# Patient Record
Sex: Male | Born: 1950 | Race: White | Hispanic: No
Health system: Midwestern US, Academic
[De-identification: ages and names within clinical notes are randomized; demographics above are authoritative.]

---

## 2016-12-27 ENCOUNTER — Encounter: Admit: 2016-12-27 | Discharge: 2016-12-27 | Payer: MEDICARE

## 2016-12-27 DIAGNOSIS — M545 Low back pain: Principal | ICD-10-CM

## 2017-01-11 ENCOUNTER — Ambulatory Visit: Admit: 2017-01-11 | Discharge: 2017-01-11 | Payer: MEDICARE

## 2017-01-11 ENCOUNTER — Encounter: Admit: 2017-01-11 | Discharge: 2017-01-11 | Payer: MEDICARE

## 2017-01-11 DIAGNOSIS — M48062 Spinal stenosis, lumbar region with neurogenic claudication: ICD-10-CM

## 2017-01-11 DIAGNOSIS — F99 Mental disorder, not otherwise specified: ICD-10-CM

## 2017-01-11 DIAGNOSIS — I10 Essential (primary) hypertension: Principal | ICD-10-CM

## 2017-01-11 DIAGNOSIS — M4802 Spinal stenosis, cervical region: ICD-10-CM

## 2017-01-11 DIAGNOSIS — M545 Low back pain: Principal | ICD-10-CM

## 2017-01-11 DIAGNOSIS — M48061 Spinal stenosis, lumbar region without neurogenic claudication: ICD-10-CM

## 2017-01-11 DIAGNOSIS — J302 Other seasonal allergic rhinitis: ICD-10-CM

## 2017-01-11 DIAGNOSIS — K5792 Diverticulitis of intestine, part unspecified, without perforation or abscess without bleeding: ICD-10-CM

## 2017-01-11 DIAGNOSIS — L0390 Cellulitis, unspecified: ICD-10-CM

## 2017-01-11 DIAGNOSIS — R200 Anesthesia of skin: Secondary | ICD-10-CM

## 2017-01-11 DIAGNOSIS — M549 Dorsalgia, unspecified: ICD-10-CM

## 2017-01-11 DIAGNOSIS — M1990 Unspecified osteoarthritis, unspecified site: ICD-10-CM

## 2017-01-11 DIAGNOSIS — E785 Hyperlipidemia, unspecified: ICD-10-CM

## 2017-02-08 ENCOUNTER — Ambulatory Visit: Admit: 2017-02-08 | Discharge: 2017-02-09 | Payer: MEDICARE

## 2017-02-08 ENCOUNTER — Ambulatory Visit: Admit: 2017-02-08 | Discharge: 2017-02-08 | Payer: MEDICARE

## 2017-02-08 ENCOUNTER — Encounter: Admit: 2017-02-08 | Discharge: 2017-02-08 | Payer: MEDICARE

## 2017-02-08 DIAGNOSIS — J302 Other seasonal allergic rhinitis: ICD-10-CM

## 2017-02-08 DIAGNOSIS — M549 Dorsalgia, unspecified: ICD-10-CM

## 2017-02-08 DIAGNOSIS — M4802 Spinal stenosis, cervical region: ICD-10-CM

## 2017-02-08 DIAGNOSIS — M1990 Unspecified osteoarthritis, unspecified site: Secondary | ICD-10-CM

## 2017-02-08 DIAGNOSIS — M48061 Spinal stenosis, lumbar region without neurogenic claudication: ICD-10-CM

## 2017-02-08 DIAGNOSIS — R200 Anesthesia of skin: Secondary | ICD-10-CM

## 2017-02-08 DIAGNOSIS — I10 Essential (primary) hypertension: Principal | ICD-10-CM

## 2017-02-08 DIAGNOSIS — L0390 Cellulitis, unspecified: ICD-10-CM

## 2017-02-08 DIAGNOSIS — E785 Hyperlipidemia, unspecified: ICD-10-CM

## 2017-02-08 DIAGNOSIS — M48062 Spinal stenosis, lumbar region with neurogenic claudication: Principal | ICD-10-CM

## 2017-02-08 DIAGNOSIS — F99 Mental disorder, not otherwise specified: ICD-10-CM

## 2017-02-08 DIAGNOSIS — K5792 Diverticulitis of intestine, part unspecified, without perforation or abscess without bleeding: ICD-10-CM

## 2017-02-08 LAB — POC CREATININE, RAD: Lab: 0.9 mg/dL (ref 0.4–1.24)

## 2017-02-08 MED ORDER — GADOBENATE DIMEGLUMINE 529 MG/ML (0.1MMOL/0.2ML) IV SOLN
20 mL | Freq: Once | INTRAVENOUS | 0 refills | Status: CP
Start: 2017-02-08 — End: ?

## 2017-02-08 NOTE — Progress Notes
[facility] CLINIC NOTE  Subjective       HISTORY:  He returns today with complaints of increasing issues with his neck.  He feels his balance and coordination is getting worse.  He also has some left shoulder pain as well as creaking in his neck.  He states he has right foot drop and feels that  is getting worse and is starting to have issues with his left foot.  His primary complaint is low back pain with pain that shoots into his thighs and buttocks,   He states he had been walking a couple of miles, but now feels that his walking has slowed down.  He has started physical therapy 10 days ago and since he has started therapy he does feel that he has seen some improvement.  He states that he has fallen a couple of times.  He feels that at this time his symptoms are manageable for him.    PHYSICAL EXAMINATION:  Upper extremity and lower extremity motor strength is full on one-time testing.  No long tract sign.    DIAGNOSTIC STUDIES: MRI scan of the cervical spine does not show any residual spinal cord compression at C3-4 despite report, there is also residual cord signal.  There is mod stenosis at C4-5, left side greater than right.  MRI scan of the lumbar spine shows no residual central stenosis.  T12-L1 and L2-5 prior surgical changes.  There is residual/progressive foraminal stenosis at multiple lumbar levels.    IMPRESSION:  1.  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  2. Lumbar spinal stenosis.    PLAN:  His MRI results were reviewed with him.  I discussed with him that he did have cord signal changes at C3-4 that are most likely permanent.  Back pain is usually from degenerative changes.  To address his back surgically could potentially destabilize multiple levels and if he were to consider surgery it would be a bigger procedure and would need to consider an instrumented fusion.  I would try and avoid surgery on his low back at this point, having undergone three surgeries already.  He was encouraged to continue with his physical therapy as helpful.  Since he feels his symptoms are currently manageable for him he will continue conservative management.  He was encouraged to contact our office should his symptoms change or become worse.  I have tried to answer all his questions at length.       Review of Systems   Constitutional: Negative.    HENT: Negative.    Eyes: Negative.    Respiratory: Negative.    Cardiovascular: Negative.    Gastrointestinal: Negative.    Endocrine: Negative.    Genitourinary: Negative.    Musculoskeletal: Negative.    Skin: Negative.    Neurological: Negative.    Hematological: Negative.    Psychiatric/Behavioral: Negative.        Current Outpatient Prescriptions:   ???  acetaminophen (TYLENOL) 325 mg tablet, Take 2 Tabs by mouth every 6 hours as needed., Disp: , Rfl: 0  ???  aspirin EC 81 mg tablet, Take 81 mg by mouth daily. Take with food., Disp: , Rfl:   ???  bisoprolol/hydrochlorothiazide (ZIAC)  10/6.25 mg tablet 1 Tab, Take 1 Tab by mouth daily., Disp: , Rfl:   ???  calcium carbonate/vitamin D-3 (OSCAL-500+D) 1250 mg/200 unit tablet, Take 1 Tab by mouth daily. Calcium Carb 1250mg  delivers 500mg  elemental Ca, Disp: , Rfl:   ???  docusate (COLACE) 100 mg capsule, Take  1 Cap by mouth twice daily., Disp: 180 Cap, Rfl: 3  ???  DULoxetine DR (CYMBALTA) 30 mg capsule, Take 30 mg by mouth twice daily., Disp: , Rfl:   ???  finasteride (PROSCAR) 5 mg tablet, Take 5 mg by mouth daily., Disp: , Rfl:   ???  fluticasone (FLONASE) 50 mcg/actuation nasal spray, Apply 1-2 Sprays to each nostril as directed daily as needed. Indications: ALLERGIC RHINITIS, Disp: , Rfl:   ???  gabapentin 600 mg Tb24, Take 1 Tab by mouth three times daily., Disp: , Rfl:   ???  ibuprofen (MOTRIN) 600 mg tablet, Take 1 tablet by mouth every 6 hours as needed for Pain. Take with food., Disp: 90 tablet, Rfl: 0  ???  lisinopril (PRINIVIL; ZESTRIL) 10 mg tablet, Take 10 mg by mouth daily., Disp: , Rfl: ???  naphazoline 0.025 % /pheniramine 0.3 % (ALLERGY EYE (NAPHAZOLINE-PHEN)) 0.025/0.3 % drop, Apply 1 Drop to both eyes every 4 hours as needed., Disp: , Rfl:   ???  oxyCODONE (ROXICODONE, OXY-IR) 5 mg tablet, Take 1-3 Tabs by mouth every 3 hours as needed, Disp: 200 Tab, Rfl: 0  ???  polyethylene glycol 3350 (GLYCOLAX; MIRALAX) 17 gram/dose powder, Take 17 g by mouth daily. (Patient taking differently: Take 17 g by mouth daily as needed.), Disp: 3 Bottle, Rfl: 3  ???  POTASSIUM/MAGNESIUM (MAGNESIUM-POTASSIUM PO), Take 1 Tab by mouth daily., Disp: , Rfl:   ???  rosuvastatin (CRESTOR) 10 mg tablet, Take 10 mg by mouth daily., Disp: , Rfl:   ???  tamsulosin (FLOMAX) 0.4 mg capsule, Take 0.4 mg by mouth daily., Disp: , Rfl:   ???  traMADol (ULTRAM) 50 mg tablet, Take 1 tablet by mouth every 6 hours as needed for Pain. Indications: PAIN, Disp: 80 tablet, Rfl: 0  ???  vitamins, multiple tablet, Take 1 Tab by mouth daily., Disp: , Rfl:   Allergies   Allergen Reactions   ??? Codeine STOMACH UPSET     Physical Exam  Vitals:    02/08/17 1114   BP: 141/88   Pulse: 76   SpO2: 99%   Weight: 99.8 kg (220 lb)   Height: 167.6 cm (65.98)     Oswestry Total Score:: 0     Body mass index is 35.53 kg/m???.         In the presence of Dr. Jean Rosenthal, I am taking down these notes, D. Lynch, scribe. 02/08/17 @ 12:05 pm

## 2017-09-06 ENCOUNTER — Encounter: Admit: 2017-09-06 | Discharge: 2017-09-06 | Payer: MEDICARE

## 2017-09-06 DIAGNOSIS — K5792 Diverticulitis of intestine, part unspecified, without perforation or abscess without bleeding: ICD-10-CM

## 2017-09-06 DIAGNOSIS — R200 Anesthesia of skin: ICD-10-CM

## 2017-09-06 DIAGNOSIS — E785 Hyperlipidemia, unspecified: ICD-10-CM

## 2017-09-06 DIAGNOSIS — M549 Dorsalgia, unspecified: ICD-10-CM

## 2017-09-06 DIAGNOSIS — L0390 Cellulitis, unspecified: ICD-10-CM

## 2017-09-06 DIAGNOSIS — M48061 Spinal stenosis, lumbar region without neurogenic claudication: ICD-10-CM

## 2017-09-06 DIAGNOSIS — M1990 Unspecified osteoarthritis, unspecified site: Secondary | ICD-10-CM

## 2017-09-06 DIAGNOSIS — F99 Mental disorder, not otherwise specified: ICD-10-CM

## 2017-09-06 DIAGNOSIS — I10 Essential (primary) hypertension: Principal | ICD-10-CM

## 2017-09-06 DIAGNOSIS — J302 Other seasonal allergic rhinitis: ICD-10-CM

## 2017-11-23 ENCOUNTER — Encounter: Admit: 2017-11-23 | Discharge: 2017-11-23 | Payer: MEDICARE

## 2017-11-28 ENCOUNTER — Encounter: Admit: 2017-11-28 | Discharge: 2017-11-28 | Payer: MEDICARE

## 2017-11-28 DIAGNOSIS — M542 Cervicalgia: Principal | ICD-10-CM

## 2017-12-26 ENCOUNTER — Encounter: Admit: 2017-12-26 | Discharge: 2017-12-26 | Payer: MEDICARE

## 2017-12-26 DIAGNOSIS — M4802 Spinal stenosis, cervical region: Principal | ICD-10-CM

## 2017-12-31 ENCOUNTER — Encounter: Admit: 2017-12-31 | Discharge: 2017-12-31 | Payer: MEDICARE

## 2017-12-31 ENCOUNTER — Ambulatory Visit: Admit: 2017-12-31 | Discharge: 2017-12-31 | Payer: MEDICARE

## 2017-12-31 DIAGNOSIS — L0390 Cellulitis, unspecified: ICD-10-CM

## 2017-12-31 DIAGNOSIS — I10 Essential (primary) hypertension: Principal | ICD-10-CM

## 2017-12-31 DIAGNOSIS — M549 Dorsalgia, unspecified: ICD-10-CM

## 2017-12-31 DIAGNOSIS — R200 Anesthesia of skin: ICD-10-CM

## 2017-12-31 DIAGNOSIS — E785 Hyperlipidemia, unspecified: ICD-10-CM

## 2017-12-31 DIAGNOSIS — M48061 Spinal stenosis, lumbar region without neurogenic claudication: ICD-10-CM

## 2017-12-31 DIAGNOSIS — F99 Mental disorder, not otherwise specified: ICD-10-CM

## 2017-12-31 DIAGNOSIS — K5792 Diverticulitis of intestine, part unspecified, without perforation or abscess without bleeding: ICD-10-CM

## 2017-12-31 DIAGNOSIS — M4802 Spinal stenosis, cervical region: Principal | ICD-10-CM

## 2017-12-31 DIAGNOSIS — J302 Other seasonal allergic rhinitis: ICD-10-CM

## 2017-12-31 DIAGNOSIS — M1990 Unspecified osteoarthritis, unspecified site: Secondary | ICD-10-CM

## 2018-01-22 ENCOUNTER — Encounter: Admit: 2018-01-22 | Discharge: 2018-01-22 | Payer: MEDICARE

## 2018-01-24 ENCOUNTER — Ambulatory Visit: Admit: 2018-01-24 | Discharge: 2018-01-24 | Payer: MEDICARE

## 2018-01-24 ENCOUNTER — Encounter: Admit: 2018-01-24 | Discharge: 2018-01-24 | Payer: MEDICARE

## 2018-01-24 DIAGNOSIS — L0390 Cellulitis, unspecified: ICD-10-CM

## 2018-01-24 DIAGNOSIS — M1990 Unspecified osteoarthritis, unspecified site: ICD-10-CM

## 2018-01-24 DIAGNOSIS — K5792 Diverticulitis of intestine, part unspecified, without perforation or abscess without bleeding: ICD-10-CM

## 2018-01-24 DIAGNOSIS — M4802 Spinal stenosis, cervical region: Secondary | ICD-10-CM

## 2018-01-24 DIAGNOSIS — M549 Dorsalgia, unspecified: ICD-10-CM

## 2018-01-24 DIAGNOSIS — R200 Anesthesia of skin: Principal | ICD-10-CM

## 2018-01-24 DIAGNOSIS — M48061 Spinal stenosis, lumbar region without neurogenic claudication: ICD-10-CM

## 2018-01-24 DIAGNOSIS — F99 Mental disorder, not otherwise specified: ICD-10-CM

## 2018-01-24 DIAGNOSIS — I10 Essential (primary) hypertension: Principal | ICD-10-CM

## 2018-01-24 DIAGNOSIS — E785 Hyperlipidemia, unspecified: ICD-10-CM

## 2018-01-24 DIAGNOSIS — J302 Other seasonal allergic rhinitis: ICD-10-CM

## 2018-01-24 MED ORDER — GADOBENATE DIMEGLUMINE 529 MG/ML (0.1MMOL/0.2ML) IV SOLN
20 mL | Freq: Once | INTRAVENOUS | 0 refills | Status: CP
Start: 2018-01-24 — End: ?
  Administered 2018-01-24: 16:00:00 20 mL via INTRAVENOUS

## 2018-01-24 MED ORDER — TRAMADOL 50 MG PO TAB
50 mg | ORAL_TABLET | ORAL | 0 refills | Status: AC | PRN
Start: 2018-01-24 — End: 2018-02-09

## 2018-01-25 ENCOUNTER — Encounter: Admit: 2018-01-25 | Discharge: 2018-01-25 | Payer: MEDICARE

## 2018-01-25 ENCOUNTER — Inpatient Hospital Stay: Admit: 2018-02-06 | Discharge: 2018-02-09 | Disposition: A | Discharge status: Home / Self Care | Payer: MEDICARE

## 2018-01-25 DIAGNOSIS — M4802 Spinal stenosis, cervical region: Principal | ICD-10-CM

## 2018-01-25 DIAGNOSIS — M542 Cervicalgia: ICD-10-CM

## 2018-01-30 ENCOUNTER — Encounter: Admit: 2018-01-30 | Discharge: 2018-01-30 | Payer: MEDICARE

## 2018-01-30 ENCOUNTER — Ambulatory Visit: Admit: 2018-01-30 | Discharge: 2018-01-31 | Payer: MEDICARE

## 2018-01-30 DIAGNOSIS — J302 Other seasonal allergic rhinitis: ICD-10-CM

## 2018-01-30 DIAGNOSIS — E785 Hyperlipidemia, unspecified: ICD-10-CM

## 2018-01-30 DIAGNOSIS — K5792 Diverticulitis of intestine, part unspecified, without perforation or abscess without bleeding: ICD-10-CM

## 2018-01-30 DIAGNOSIS — M48061 Spinal stenosis, lumbar region without neurogenic claudication: ICD-10-CM

## 2018-01-30 DIAGNOSIS — L0390 Cellulitis, unspecified: ICD-10-CM

## 2018-01-30 DIAGNOSIS — I10 Essential (primary) hypertension: Principal | ICD-10-CM

## 2018-01-30 DIAGNOSIS — M549 Dorsalgia, unspecified: ICD-10-CM

## 2018-01-30 DIAGNOSIS — Z8719 Personal history of other diseases of the digestive system: ICD-10-CM

## 2018-01-30 DIAGNOSIS — F99 Mental disorder, not otherwise specified: ICD-10-CM

## 2018-01-30 DIAGNOSIS — M1990 Unspecified osteoarthritis, unspecified site: ICD-10-CM

## 2018-01-30 DIAGNOSIS — R200 Anesthesia of skin: ICD-10-CM

## 2018-01-30 LAB — COMPREHENSIVE METABOLIC PANEL
Lab: 0.7 mg/dL — ABNORMAL LOW (ref 0.3–1.2)
Lab: 0.9 mg/dL (ref 0.4–1.24)
Lab: 101 U/L (ref 25–110)
Lab: 135 MMOL/L — ABNORMAL LOW (ref 137–147)
Lab: 17 U/L (ref 7–56)
Lab: 20 mg/dL (ref 7–25)
Lab: 21 U/L (ref 7–40)
Lab: 27 MMOL/L (ref 21–30)
Lab: 4.2 g/dL (ref 3.5–5.0)
Lab: 4.3 MMOL/L (ref 3.5–5.1)
Lab: 7.1 g/dL (ref 6.0–8.0)
Lab: 9.5 mg/dL (ref 8.5–10.6)
Lab: 96 mg/dL (ref 70–100)
Lab: 98 MMOL/L (ref 98–110)
Lab: >60 mL/min (ref >60)
Lab: >60 mL/min (ref >60)
Lab: 10 (ref 3–12)

## 2018-01-30 LAB — CBC: Lab: 5.7 10*3/uL (ref 4.5–11.0)

## 2018-01-31 DIAGNOSIS — M542 Cervicalgia: Principal | ICD-10-CM

## 2018-02-05 ENCOUNTER — Encounter: Admit: 2018-02-05 | Discharge: 2018-02-05 | Payer: MEDICARE

## 2018-02-05 DIAGNOSIS — E785 Hyperlipidemia, unspecified: ICD-10-CM

## 2018-02-05 DIAGNOSIS — R200 Anesthesia of skin: Secondary | ICD-10-CM

## 2018-02-05 DIAGNOSIS — M1990 Unspecified osteoarthritis, unspecified site: Secondary | ICD-10-CM

## 2018-02-05 DIAGNOSIS — F99 Mental disorder, not otherwise specified: ICD-10-CM

## 2018-02-05 DIAGNOSIS — M48061 Spinal stenosis, lumbar region without neurogenic claudication: ICD-10-CM

## 2018-02-05 DIAGNOSIS — K5792 Diverticulitis of intestine, part unspecified, without perforation or abscess without bleeding: ICD-10-CM

## 2018-02-05 DIAGNOSIS — J302 Other seasonal allergic rhinitis: ICD-10-CM

## 2018-02-05 DIAGNOSIS — Z8719 Personal history of other diseases of the digestive system: ICD-10-CM

## 2018-02-05 DIAGNOSIS — I10 Essential (primary) hypertension: Principal | ICD-10-CM

## 2018-02-05 DIAGNOSIS — L0390 Cellulitis, unspecified: ICD-10-CM

## 2018-02-05 DIAGNOSIS — M549 Dorsalgia, unspecified: ICD-10-CM

## 2018-02-06 ENCOUNTER — Encounter: Admit: 2018-02-06 | Discharge: 2018-02-06 | Payer: MEDICARE

## 2018-02-06 MED ORDER — LIDOCAINE (PF) 200 MG/10 ML (2 %) IJ SYRG
0 refills | Status: DC
Start: 2018-02-06 — End: 2018-02-06
  Administered 2018-02-06: 17:00:00 80 mg via INTRAVENOUS

## 2018-02-06 MED ORDER — NALOXONE 0.4 MG/ML IJ SOLN
.08 mg | INTRAVENOUS | 0 refills | Status: DC | PRN
Start: 2018-02-06 — End: 2018-02-09

## 2018-02-06 MED ORDER — ELECTROLYTE-A IV SOLP
0 refills | Status: DC
Start: 2018-02-06 — End: 2018-02-06
  Administered 2018-02-06 (×2): via INTRAVENOUS

## 2018-02-06 MED ORDER — HYDROMORPHONE (PF) 2 MG/ML IJ SYRG
.5 mg | INTRAVENOUS | 0 refills | Status: DC | PRN
Start: 2018-02-06 — End: 2018-02-06

## 2018-02-06 MED ORDER — BISOPROLOL-HYDROCHLOROTHIAZIDE 10/6.25 MG TAB
Freq: Every day | ORAL | 0 refills | Status: DC
Start: 2018-02-06 — End: 2018-02-09
  Administered 2018-02-07 – 2018-02-09 (×6): via ORAL

## 2018-02-06 MED ORDER — LIDOCAINE (PF) 10 MG/ML (1 %) IJ SOLN
.1-2 mL | INTRAMUSCULAR | 0 refills | Status: DC | PRN
Start: 2018-02-06 — End: 2018-02-06

## 2018-02-06 MED ORDER — BISOPROLOL-HYDROCHLOROTHIAZIDE 10/6.25 MG TAB
1 | Freq: Every day | ORAL | 0 refills | Status: DC
Start: 2018-02-06 — End: 2018-02-06

## 2018-02-06 MED ORDER — POLYETHYLENE GLYCOL 3350 17 GRAM PO PWPK
1 | Freq: Two times a day (BID) | ORAL | 0 refills | Status: DC
Start: 2018-02-06 — End: 2018-02-09
  Administered 2018-02-07 – 2018-02-08 (×3): 17 g via ORAL

## 2018-02-06 MED ORDER — SUCCINYLCHOLINE CHLORIDE 20 MG/ML IJ SOLN
INTRAVENOUS | 0 refills | Status: DC
Start: 2018-02-06 — End: 2018-02-06
  Administered 2018-02-06: 17:00:00 100 mg via INTRAVENOUS

## 2018-02-06 MED ORDER — PHENYLEPHRINE IN 0.9% NACL(PF) 1 MG/10 ML (100 MCG/ML) IV SYRG
INTRAVENOUS | 0 refills | Status: DC
Start: 2018-02-06 — End: 2018-02-06
  Administered 2018-02-06: 18:00:00 100 ug via INTRAVENOUS
  Administered 2018-02-06: 18:00:00 200 ug via INTRAVENOUS
  Administered 2018-02-06: 18:00:00 300 ug via INTRAVENOUS
  Administered 2018-02-06 (×2): 200 ug via INTRAVENOUS

## 2018-02-06 MED ORDER — ROCURONIUM 10 MG/ML IV SOLN
0 refills | Status: DC
Start: 2018-02-06 — End: 2018-02-06
  Administered 2018-02-06: 18:00:00 5 mg via INTRAVENOUS
  Administered 2018-02-06: 18:00:00 10 mg via INTRAVENOUS

## 2018-02-06 MED ORDER — LISINOPRIL 10 MG PO TAB
10 mg | Freq: Every day | ORAL | 0 refills | Status: DC
Start: 2018-02-06 — End: 2018-02-09
  Administered 2018-02-07 – 2018-02-09 (×4): 10 mg via ORAL

## 2018-02-06 MED ORDER — EPHEDRINE SULFATE 50 MG/ML IJ SOLN
0 refills | Status: DC
Start: 2018-02-06 — End: 2018-02-06
  Administered 2018-02-06 (×3): 10 mg via INTRAVENOUS
  Administered 2018-02-06: 18:00:00 5 mg via INTRAVENOUS
  Administered 2018-02-06: 18:00:00 15 mg via INTRAVENOUS

## 2018-02-06 MED ORDER — FENTANYL PCA 550 MCG/55 ML SYR (STD CONC)(ADULT)(PREMADE)
INTRAVENOUS | 0 refills | Status: DC
Start: 2018-02-06 — End: 2018-02-07
  Administered 2018-02-06: 23:00:00 55.000 mL via INTRAVENOUS

## 2018-02-06 MED ORDER — PROPOFOL 10 MG/ML IV EMUL (INFUSION)(AM)(OR)
0 refills | Status: DC
Start: 2018-02-06 — End: 2018-02-06
  Administered 2018-02-06: 20:00:00 100.000 mL via INTRAVENOUS
  Administered 2018-02-06: 17:00:00 100 ug/kg/min via INTRAVENOUS

## 2018-02-06 MED ORDER — HALOPERIDOL LACTATE 5 MG/ML IJ SOLN
1 mg | Freq: Once | INTRAVENOUS | 0 refills | Status: DC | PRN
Start: 2018-02-06 — End: 2018-02-06

## 2018-02-06 MED ORDER — DIPHENHYDRAMINE HCL 50 MG/ML IJ SOLN
25 mg | INTRAVENOUS | 0 refills | Status: DC | PRN
Start: 2018-02-06 — End: 2018-02-09

## 2018-02-06 MED ORDER — DEXTRAN 70-HYPROMELLOSE (PF) 0.1-0.3 % OP DPET
0 refills | Status: DC
Start: 2018-02-06 — End: 2018-02-06
  Administered 2018-02-06: 17:00:00 2 [drp] via OPHTHALMIC

## 2018-02-06 MED ORDER — LACTATED RINGERS IV SOLP
1000 mL | INTRAVENOUS | 0 refills | Status: AC
Start: 2018-02-06 — End: ?
  Administered 2018-02-06: 15:00:00 1000 mL via INTRAVENOUS

## 2018-02-06 MED ORDER — PHENYLEPHRINE IV DRIP (DBL CONC)
0 refills | Status: DC
Start: 2018-02-06 — End: 2018-02-06
  Administered 2018-02-06 (×2): .6 ug/kg/min via INTRAVENOUS

## 2018-02-06 MED ORDER — DULOXETINE 30 MG PO CPDR
30 mg | Freq: Two times a day (BID) | ORAL | 0 refills | Status: DC
Start: 2018-02-06 — End: 2018-02-09
  Administered 2018-02-07 – 2018-02-09 (×6): 30 mg via ORAL

## 2018-02-06 MED ORDER — FAMOTIDINE 20 MG PO TAB
20 mg | Freq: Two times a day (BID) | ORAL | 0 refills | Status: DC
Start: 2018-02-06 — End: 2018-02-09
  Administered 2018-02-07 – 2018-02-09 (×6): 20 mg via ORAL

## 2018-02-06 MED ORDER — DEXAMETHASONE SODIUM PHOSPHATE 4 MG/ML IJ SOLN
INTRAVENOUS | 0 refills | Status: DC
Start: 2018-02-06 — End: 2018-02-06
  Administered 2018-02-06: 18:00:00 4 mg via INTRAVENOUS

## 2018-02-06 MED ORDER — FENTANYL CITRATE (PF) 50 MCG/ML IJ SOLN
0 refills | Status: DC
Start: 2018-02-06 — End: 2018-02-06
  Administered 2018-02-06: 17:00:00 100 ug via INTRAVENOUS

## 2018-02-06 MED ORDER — THROMBIN (BOVINE) 5,000 UNIT TP SOLR
0 refills | Status: DC
Start: 2018-02-06 — End: 2018-02-06
  Administered 2018-02-06: 18:00:00 5000 [IU] via TOPICAL

## 2018-02-06 MED ORDER — DIPHENHYDRAMINE HCL 25 MG PO CAP
25 mg | ORAL | 0 refills | Status: DC | PRN
Start: 2018-02-06 — End: 2018-02-09

## 2018-02-06 MED ORDER — LACTATED RINGERS IV SOLP
INTRAVENOUS | 0 refills | Status: DC
Start: 2018-02-06 — End: 2018-02-06

## 2018-02-06 MED ORDER — TAMSULOSIN 0.4 MG PO CAP
.4 mg | Freq: Every day | ORAL | 0 refills | Status: DC
Start: 2018-02-06 — End: 2018-02-09
  Administered 2018-02-07 – 2018-02-09 (×3): 0.4 mg via ORAL

## 2018-02-06 MED ORDER — PROPOFOL INJ 10 MG/ML IV VIAL
0 refills | Status: DC
Start: 2018-02-06 — End: 2018-02-06
  Administered 2018-02-06: 17:00:00 150 mg via INTRAVENOUS
  Administered 2018-02-06: 17:00:00 50 mg via INTRAVENOUS
  Administered 2018-02-06 (×2): 40 mg via INTRAVENOUS

## 2018-02-06 MED ORDER — POTASSIUM CHLORIDE IN 0.9%NACL 20 MEQ/L IV SOLP
1000 mL | INTRAVENOUS | 0 refills | Status: AC
Start: 2018-02-06 — End: ?
  Administered 2018-02-06 – 2018-02-07 (×3): 1000 mL via INTRAVENOUS

## 2018-02-06 MED ORDER — DEXAMETHASONE 4MG/ML (5-40 KG) IVP
4 mg | INTRAVENOUS | 0 refills | Status: DC
Start: 2018-02-06 — End: 2018-02-07
  Administered 2018-02-07 (×3): 4 mg via INTRAVENOUS

## 2018-02-06 MED ORDER — GABAPENTIN 300 MG PO CAP
300 mg | Freq: Once | ORAL | 0 refills | Status: CP
Start: 2018-02-06 — End: ?
  Administered 2018-02-06: 15:00:00 300 mg via ORAL

## 2018-02-06 MED ORDER — BISACODYL 10 MG RE SUPP
10 mg | Freq: Every day | RECTAL | 0 refills | Status: DC | PRN
Start: 2018-02-06 — End: 2018-02-09

## 2018-02-06 MED ORDER — CEFAZOLIN 1 GRAM IJ SOLR
0 refills | Status: DC
Start: 2018-02-06 — End: 2018-02-06
  Administered 2018-02-06: 18:00:00 2 g via INTRAVENOUS

## 2018-02-06 MED ORDER — OXYCODONE 5 MG PO TAB
5-10 mg | Freq: Once | ORAL | 0 refills | Status: DC | PRN
Start: 2018-02-06 — End: 2018-02-06

## 2018-02-06 MED ORDER — CEFAZOLIN INJ 1GM IVP
2 g | INTRAVENOUS | 0 refills | Status: CP
Start: 2018-02-06 — End: ?
  Administered 2018-02-07 (×2): 2 g via INTRAVENOUS

## 2018-02-06 MED ORDER — PROMETHAZINE 25 MG/ML IJ SOLN
6.25 mg | INTRAVENOUS | 0 refills | Status: DC | PRN
Start: 2018-02-06 — End: 2018-02-06

## 2018-02-06 MED ORDER — KETAMINE 10 MG/ML IJ SOLN (INFUSION)(AM)(OR)
0 refills | Status: DC
Start: 2018-02-06 — End: 2018-02-06
  Administered 2018-02-06: 17:00:00 200 ug/kg/h via INTRAVENOUS

## 2018-02-06 MED ORDER — DOCUSATE SODIUM 100 MG PO CAP
100 mg | Freq: Two times a day (BID) | ORAL | 0 refills | Status: DC
Start: 2018-02-06 — End: 2018-02-09
  Administered 2018-02-07 – 2018-02-09 (×6): 100 mg via ORAL

## 2018-02-06 MED ORDER — MAGNESIUM HYDROXIDE 2,400 MG/10 ML PO SUSP
10 mL | Freq: Every day | ORAL | 0 refills | Status: DC
Start: 2018-02-06 — End: 2018-02-09
  Administered 2018-02-08 – 2018-02-09 (×2): 10 mL via ORAL

## 2018-02-06 MED ORDER — SUFENTANIL 100 MCG IN NS 10 ML (OR)
0 refills | Status: DC
Start: 2018-02-06 — End: 2018-02-06
  Administered 2018-02-06 (×2): 0.1 ug/kg/h via INTRAVENOUS

## 2018-02-06 MED ORDER — ACETAMINOPHEN 500 MG PO TAB
1000 mg | Freq: Once | ORAL | 0 refills | Status: CP
Start: 2018-02-06 — End: ?
  Administered 2018-02-06: 15:00:00 1000 mg via ORAL

## 2018-02-06 MED ORDER — DIPHENHYDRAMINE HCL 50 MG/ML IJ SOLN
25 mg | Freq: Once | INTRAVENOUS | 0 refills | Status: DC | PRN
Start: 2018-02-06 — End: 2018-02-06

## 2018-02-06 MED ORDER — FENTANYL CITRATE (PF) 50 MCG/ML IJ SOLN
50 ug | INTRAVENOUS | 0 refills | Status: DC | PRN
Start: 2018-02-06 — End: 2018-02-06

## 2018-02-06 MED ORDER — ONDANSETRON HCL (PF) 4 MG/2 ML IJ SOLN
INTRAVENOUS | 0 refills | Status: DC
Start: 2018-02-06 — End: 2018-02-06
  Administered 2018-02-06: 21:00:00 4 mg via INTRAVENOUS

## 2018-02-06 MED ORDER — ACETAMINOPHEN 325 MG PO TAB
650 mg | ORAL | 0 refills | Status: DC
Start: 2018-02-06 — End: 2018-02-09
  Administered 2018-02-07 – 2018-02-09 (×10): 650 mg via ORAL

## 2018-02-07 ENCOUNTER — Encounter: Admit: 2018-02-07 | Discharge: 2018-02-07 | Payer: MEDICARE

## 2018-02-07 DIAGNOSIS — Z8719 Personal history of other diseases of the digestive system: ICD-10-CM

## 2018-02-07 DIAGNOSIS — F99 Mental disorder, not otherwise specified: ICD-10-CM

## 2018-02-07 DIAGNOSIS — R200 Anesthesia of skin: Secondary | ICD-10-CM

## 2018-02-07 DIAGNOSIS — E785 Hyperlipidemia, unspecified: ICD-10-CM

## 2018-02-07 DIAGNOSIS — L0390 Cellulitis, unspecified: ICD-10-CM

## 2018-02-07 DIAGNOSIS — M48061 Spinal stenosis, lumbar region without neurogenic claudication: ICD-10-CM

## 2018-02-07 DIAGNOSIS — I10 Essential (primary) hypertension: Principal | ICD-10-CM

## 2018-02-07 DIAGNOSIS — K5792 Diverticulitis of intestine, part unspecified, without perforation or abscess without bleeding: ICD-10-CM

## 2018-02-07 DIAGNOSIS — M549 Dorsalgia, unspecified: ICD-10-CM

## 2018-02-07 DIAGNOSIS — M1990 Unspecified osteoarthritis, unspecified site: ICD-10-CM

## 2018-02-07 DIAGNOSIS — J302 Other seasonal allergic rhinitis: ICD-10-CM

## 2018-02-07 LAB — BASIC METABOLIC PANEL: Lab: 137 MMOL/L (ref 137–147)

## 2018-02-07 LAB — CBC: Lab: 7.4 K/UL — ABNORMAL LOW (ref >60)

## 2018-02-07 MED ORDER — OXYCODONE 5 MG PO TAB
5-15 mg | ORAL | 0 refills | Status: DC | PRN
Start: 2018-02-07 — End: 2018-02-09
  Administered 2018-02-07: 13:00:00 15 mg via ORAL
  Administered 2018-02-07: 23:00:00 5 mg via ORAL
  Administered 2018-02-08 – 2018-02-09 (×3): 10 mg via ORAL

## 2018-02-07 MED ORDER — FENTANYL CITRATE (PF) 50 MCG/ML IJ SOLN
25-50 ug | INTRAVENOUS | 0 refills | Status: DC | PRN
Start: 2018-02-07 — End: 2018-02-09

## 2018-02-07 MED ORDER — DEXAMETHASONE SODIUM PHOSPHATE 4 MG/ML IJ SOLN
4 mg | INTRAVENOUS | 0 refills | Status: DC
Start: 2018-02-07 — End: 2018-02-08
  Administered 2018-02-07 – 2018-02-08 (×5): 4 mg via INTRAVENOUS

## 2018-02-08 ENCOUNTER — Encounter: Admit: 2018-02-08 | Discharge: 2018-02-08 | Payer: MEDICARE

## 2018-02-08 DIAGNOSIS — Z8719 Personal history of other diseases of the digestive system: ICD-10-CM

## 2018-02-08 DIAGNOSIS — I10 Essential (primary) hypertension: Principal | ICD-10-CM

## 2018-02-08 DIAGNOSIS — J302 Other seasonal allergic rhinitis: ICD-10-CM

## 2018-02-08 DIAGNOSIS — M549 Dorsalgia, unspecified: ICD-10-CM

## 2018-02-08 DIAGNOSIS — L0390 Cellulitis, unspecified: ICD-10-CM

## 2018-02-08 DIAGNOSIS — R200 Anesthesia of skin: Secondary | ICD-10-CM

## 2018-02-08 DIAGNOSIS — M48061 Spinal stenosis, lumbar region without neurogenic claudication: ICD-10-CM

## 2018-02-08 DIAGNOSIS — M1990 Unspecified osteoarthritis, unspecified site: ICD-10-CM

## 2018-02-08 DIAGNOSIS — F99 Mental disorder, not otherwise specified: ICD-10-CM

## 2018-02-08 DIAGNOSIS — E785 Hyperlipidemia, unspecified: ICD-10-CM

## 2018-02-08 DIAGNOSIS — K5792 Diverticulitis of intestine, part unspecified, without perforation or abscess without bleeding: ICD-10-CM

## 2018-02-08 LAB — CBC: Lab: 9.5 K/UL — ABNORMAL LOW (ref >60)

## 2018-02-08 LAB — BASIC METABOLIC PANEL: Lab: 138 MMOL/L — ABNORMAL LOW (ref 137–147)

## 2018-02-08 MED ORDER — OXYCODONE 5 MG PO TAB
5-15 mg | ORAL | 0 refills | Status: CN | PRN
Start: 2018-02-08 — End: ?

## 2018-02-08 MED ORDER — OXYCODONE 5 MG PO TAB
5 mg | ORAL_TABLET | ORAL | 0 refills | Status: CN | PRN
Start: 2018-02-08 — End: ?

## 2018-02-09 ENCOUNTER — Encounter: Admit: 2018-01-30 | Discharge: 2018-01-30 | Payer: MEDICARE

## 2018-02-09 ENCOUNTER — Encounter: Admit: 2018-02-06 | Discharge: 2018-02-06 | Payer: MEDICARE

## 2018-02-09 DIAGNOSIS — M4802 Spinal stenosis, cervical region: Principal | ICD-10-CM

## 2018-02-09 DIAGNOSIS — M2578 Osteophyte, vertebrae: ICD-10-CM

## 2018-02-09 DIAGNOSIS — M4712 Other spondylosis with myelopathy, cervical region: ICD-10-CM

## 2018-02-09 DIAGNOSIS — L905 Scar conditions and fibrosis of skin: ICD-10-CM

## 2018-02-09 DIAGNOSIS — Z981 Arthrodesis status: ICD-10-CM

## 2018-02-09 LAB — CBC: Lab: 9.4 10*3/uL — ABNORMAL LOW (ref >60)

## 2018-02-09 LAB — BASIC METABOLIC PANEL: Lab: 137 MMOL/L — ABNORMAL LOW (ref >60)

## 2018-02-09 MED ORDER — OXYCODONE 5 MG PO TAB
5-10 mg | ORAL_TABLET | ORAL | 0 refills | 6.00000 days | Status: AC | PRN
Start: 2018-02-09 — End: ?

## 2018-02-27 ENCOUNTER — Encounter: Admit: 2018-02-27 | Discharge: 2018-02-27 | Payer: MEDICARE

## 2018-02-27 DIAGNOSIS — Z981 Arthrodesis status: Principal | ICD-10-CM

## 2018-02-28 ENCOUNTER — Ambulatory Visit: Admit: 2018-02-28 | Discharge: 2018-02-28 | Payer: MEDICARE

## 2018-02-28 ENCOUNTER — Encounter: Admit: 2018-02-28 | Discharge: 2018-02-28 | Payer: MEDICARE

## 2018-02-28 DIAGNOSIS — I10 Essential (primary) hypertension: Principal | ICD-10-CM

## 2018-02-28 DIAGNOSIS — Z981 Arthrodesis status: Principal | ICD-10-CM

## 2018-02-28 DIAGNOSIS — K5792 Diverticulitis of intestine, part unspecified, without perforation or abscess without bleeding: ICD-10-CM

## 2018-02-28 DIAGNOSIS — E785 Hyperlipidemia, unspecified: ICD-10-CM

## 2018-02-28 DIAGNOSIS — R200 Anesthesia of skin: Secondary | ICD-10-CM

## 2018-02-28 DIAGNOSIS — L0390 Cellulitis, unspecified: ICD-10-CM

## 2018-02-28 DIAGNOSIS — M549 Dorsalgia, unspecified: ICD-10-CM

## 2018-02-28 DIAGNOSIS — Z8719 Personal history of other diseases of the digestive system: ICD-10-CM

## 2018-02-28 DIAGNOSIS — J302 Other seasonal allergic rhinitis: ICD-10-CM

## 2018-02-28 DIAGNOSIS — M1990 Unspecified osteoarthritis, unspecified site: Secondary | ICD-10-CM

## 2018-02-28 DIAGNOSIS — M4802 Spinal stenosis, cervical region: ICD-10-CM

## 2018-02-28 DIAGNOSIS — F99 Mental disorder, not otherwise specified: ICD-10-CM

## 2018-02-28 DIAGNOSIS — M48061 Spinal stenosis, lumbar region without neurogenic claudication: ICD-10-CM

## 2018-03-19 ENCOUNTER — Encounter: Admit: 2018-03-19 | Discharge: 2018-03-19 | Payer: MEDICARE

## 2018-03-19 DIAGNOSIS — Z981 Arthrodesis status: Principal | ICD-10-CM

## 2018-03-21 ENCOUNTER — Encounter: Admit: 2018-03-21 | Discharge: 2018-03-21 | Payer: MEDICARE

## 2018-03-21 ENCOUNTER — Ambulatory Visit: Admit: 2018-03-21 | Discharge: 2018-03-21 | Payer: MEDICARE

## 2018-03-21 DIAGNOSIS — R200 Anesthesia of skin: ICD-10-CM

## 2018-03-21 DIAGNOSIS — L0390 Cellulitis, unspecified: ICD-10-CM

## 2018-03-21 DIAGNOSIS — M549 Dorsalgia, unspecified: ICD-10-CM

## 2018-03-21 DIAGNOSIS — M48061 Spinal stenosis, lumbar region without neurogenic claudication: ICD-10-CM

## 2018-03-21 DIAGNOSIS — M4802 Spinal stenosis, cervical region: ICD-10-CM

## 2018-03-21 DIAGNOSIS — E785 Hyperlipidemia, unspecified: ICD-10-CM

## 2018-03-21 DIAGNOSIS — J302 Other seasonal allergic rhinitis: ICD-10-CM

## 2018-03-21 DIAGNOSIS — Z981 Arthrodesis status: Principal | ICD-10-CM

## 2018-03-21 DIAGNOSIS — I10 Essential (primary) hypertension: Principal | ICD-10-CM

## 2018-03-21 DIAGNOSIS — Z8719 Personal history of other diseases of the digestive system: ICD-10-CM

## 2018-03-21 DIAGNOSIS — K5792 Diverticulitis of intestine, part unspecified, without perforation or abscess without bleeding: ICD-10-CM

## 2018-03-21 DIAGNOSIS — F99 Mental disorder, not otherwise specified: ICD-10-CM

## 2018-03-21 DIAGNOSIS — M1990 Unspecified osteoarthritis, unspecified site: ICD-10-CM

## 2018-04-30 ENCOUNTER — Encounter: Admit: 2018-04-30 | Discharge: 2018-04-30 | Payer: MEDICARE

## 2018-04-30 DIAGNOSIS — Z981 Arthrodesis status: Principal | ICD-10-CM

## 2018-05-06 ENCOUNTER — Ambulatory Visit: Admit: 2018-05-06 | Discharge: 2018-05-06 | Payer: MEDICARE

## 2018-05-06 ENCOUNTER — Encounter: Admit: 2018-05-06 | Discharge: 2018-05-06 | Payer: MEDICARE

## 2018-05-06 DIAGNOSIS — R200 Anesthesia of skin: Secondary | ICD-10-CM

## 2018-05-06 DIAGNOSIS — K5792 Diverticulitis of intestine, part unspecified, without perforation or abscess without bleeding: ICD-10-CM

## 2018-05-06 DIAGNOSIS — M48061 Spinal stenosis, lumbar region without neurogenic claudication: ICD-10-CM

## 2018-05-06 DIAGNOSIS — M1990 Unspecified osteoarthritis, unspecified site: ICD-10-CM

## 2018-05-06 DIAGNOSIS — I10 Essential (primary) hypertension: Principal | ICD-10-CM

## 2018-05-06 DIAGNOSIS — M4802 Spinal stenosis, cervical region: ICD-10-CM

## 2018-05-06 DIAGNOSIS — M549 Dorsalgia, unspecified: ICD-10-CM

## 2018-05-06 DIAGNOSIS — Z981 Arthrodesis status: Principal | ICD-10-CM

## 2018-05-06 DIAGNOSIS — J302 Other seasonal allergic rhinitis: ICD-10-CM

## 2018-05-06 DIAGNOSIS — Z8719 Personal history of other diseases of the digestive system: ICD-10-CM

## 2018-05-06 DIAGNOSIS — L0390 Cellulitis, unspecified: ICD-10-CM

## 2018-05-06 DIAGNOSIS — E785 Hyperlipidemia, unspecified: ICD-10-CM

## 2018-05-06 DIAGNOSIS — F99 Mental disorder, not otherwise specified: ICD-10-CM

## 2018-11-05 ENCOUNTER — Encounter: Admit: 2018-11-05 | Discharge: 2018-11-05

## 2018-11-05 ENCOUNTER — Ambulatory Visit: Admit: 2018-11-05 | Discharge: 2018-11-06

## 2018-11-05 DIAGNOSIS — M48061 Spinal stenosis, lumbar region without neurogenic claudication: Secondary | ICD-10-CM

## 2018-11-05 DIAGNOSIS — J302 Other seasonal allergic rhinitis: Secondary | ICD-10-CM

## 2018-11-05 DIAGNOSIS — Z8719 Personal history of other diseases of the digestive system: Secondary | ICD-10-CM

## 2018-11-05 DIAGNOSIS — G629 Polyneuropathy, unspecified: Principal | ICD-10-CM

## 2018-11-05 DIAGNOSIS — F99 Mental disorder, not otherwise specified: Secondary | ICD-10-CM

## 2018-11-05 DIAGNOSIS — M549 Dorsalgia, unspecified: Secondary | ICD-10-CM

## 2018-11-05 DIAGNOSIS — E785 Hyperlipidemia, unspecified: Secondary | ICD-10-CM

## 2018-11-05 DIAGNOSIS — Q999 Chromosomal abnormality, unspecified: Secondary | ICD-10-CM

## 2018-11-05 DIAGNOSIS — L0390 Cellulitis, unspecified: Secondary | ICD-10-CM

## 2018-11-05 DIAGNOSIS — I10 Essential (primary) hypertension: Secondary | ICD-10-CM

## 2018-11-05 DIAGNOSIS — R739 Hyperglycemia, unspecified: Secondary | ICD-10-CM

## 2018-11-05 DIAGNOSIS — M1990 Unspecified osteoarthritis, unspecified site: Secondary | ICD-10-CM

## 2018-11-05 DIAGNOSIS — R2689 Other abnormalities of gait and mobility: Secondary | ICD-10-CM

## 2018-11-05 DIAGNOSIS — K5792 Diverticulitis of intestine, part unspecified, without perforation or abscess without bleeding: Secondary | ICD-10-CM

## 2018-11-05 DIAGNOSIS — R200 Anesthesia of skin: Secondary | ICD-10-CM

## 2018-11-05 DIAGNOSIS — R079 Chest pain, unspecified: Secondary | ICD-10-CM

## 2018-11-05 LAB — CBC AND DIFF
Lab: 0 10*3/uL (ref 0–0.20)
Lab: 0.5 10*3/uL (ref 0–0.80)
Lab: 272 10*3/uL (ref 150–400)
Lab: 3 % (ref 0–5)
Lab: 3.8 K/UL (ref >60)
Lab: 33 % (ref 24–44)
Lab: 55 % (ref 41–77)
Lab: 6.9 K/UL (ref 4.5–11.0)
Lab: 7 FL — ABNORMAL HIGH (ref 7–11)
Lab: 8 % (ref 4–12)

## 2018-11-05 LAB — COMPREHENSIVE METABOLIC PANEL: Lab: 136 MMOL/L — ABNORMAL LOW (ref 137–147)

## 2018-11-05 LAB — VITAMIN B12: Lab: 523 pg/mL — ABNORMAL LOW (ref >60)

## 2018-11-05 MED ORDER — NORTRIPTYLINE 10 MG PO CAP
ORAL_CAPSULE | 5 refills | Status: DC
Start: 2018-11-05 — End: 2019-03-10

## 2018-11-05 NOTE — Progress Notes
Date of Service: 11/05/2018    Subjective:             Kyle Cunningham is a 68 y.o. male with possible neuropathy.    History of Present Illness            The patient has h/o of fusion in the C- and L-spine. Knee replacement on both sides  He reports tingling in both feet. Numbness in the feet. Dull aches as well. Left leg is worse    Had CTS release and ulnar release multiple times   Hands feel cold. Sharp pain and aching pain. Sometimes pain keeps him up at night.   Decrease feeling in the hands which affect their function.   Uses a cane. he reports imbalance and had falls before     Had weight increase since the pandemic started     First feet symptoms was 5 yrs ago.     Daughter has prediabetes    No FH of neuropathy   Drinks no more than 10 beers per week     No lightheadedness upon standing     On Cymbalta 30 mg BID for mood. Was on gabapentin before but quit because it did not help  No h/o renal stones or disease.       Review of Systems   Constitutional: Positive for activity change and fatigue.   HENT: Positive for dental problem, hearing loss and tinnitus.    Eyes: Positive for itching.   Musculoskeletal: Positive for arthralgias, back pain and gait problem.   Allergic/Immunologic: Positive for environmental allergies.   Neurological: Positive for weakness and numbness.   All other systems reviewed and are negative.        Objective:         ??? acetaminophen (TYLENOL) 325 mg tablet Take 2 Tabs by mouth every 6 hours as needed.   ??? aspirin EC 81 mg tablet Take 81 mg by mouth daily. Take with food.   ??? atorvastatin (LIPITOR) 20 mg tablet Take 20 mg by mouth daily.   ??? bisoprolol/hydrochlorothiazide (ZIAC)  10/6.25 mg tablet 1 Tab Take 1 Tab by mouth daily.   ??? calcium carbonate/vitamin D-3 (OSCAL-500+D) 1250 mg/200 unit tablet Take 1 Tab by mouth daily. Calcium Carb 1250mg  delivers 500mg  elemental Ca   ??? cetirizine (ZYRTEC) 10 mg tablet Take 10 mg by mouth every morning. ??? docusate (COLACE) 100 mg capsule Take 1 Cap by mouth twice daily.   ??? DULoxetine DR (CYMBALTA) 30 mg capsule Take 30 mg by mouth twice daily.   ??? fluticasone (FLONASE) 50 mcg/actuation nasal spray Apply 1-2 Sprays to each nostril as directed daily as needed. Indications: ALLERGIC RHINITIS   ??? lisinopril (PRINIVIL; ZESTRIL) 10 mg tablet Take 10 mg by mouth daily.   ??? naphazoline 0.025 % /pheniramine 0.3 % (ALLERGY EYE (NAPHAZOLINE-PHEN)) 0.025/0.3 % drop Apply 1 Drop to both eyes every 4 hours as needed.   ??? oxyCODONE (ROXICODONE, OXY-IR) 5 mg tablet Take one tablet to two tablets by mouth every 4 hours as needed   ??? polyethylene glycol 3350 (GLYCOLAX; MIRALAX) 17 gram/dose powder Take 17 g by mouth daily. (Patient taking differently: Take 17 g by mouth daily as needed.)   ??? POTASSIUM/MAGNESIUM (MAGNESIUM-POTASSIUM PO) Take 1 Tab by mouth daily.   ??? sodium chloride(+) (AK-NACL; ALTACHLORE; MURO; SOCHLOR) 5 % ophthalmic ointment Apply 0.25 inches to both eyes twice daily.   ??? tamsulosin (FLOMAX) 0.4 mg capsule Take 0.4 mg by mouth daily.   ???  vitamins, multiple tablet Take 1 Tab by mouth daily.     Vitals:    11/05/18 1522   Height: 167.6 cm (65.98)   PainSc: Three     Body mass index is 36.5 kg/m???.     Physical Exam            General Examination:  Vitals:   Vitals:    11/05/18 1538   BP: (!) 155/95   Pulse: 67     General: No acute distress.  HEENT: No eye discharges.    Cardiac: Regular  Abdominal: Soft. Non tender to palpate.   Extremities: No edema or cyanosis    Neurological Examination    MS: Patient is alert and oriented    Speech: Normal    Cranial Nerves:                                                   CN III, IV and VI:  Intact EOM                                                           CN V: Normal facial sensation  CN VIII: Normal hearing   CN IX and X: Symmetric palatal elevation  CN XI: Normal        CN XII: Tongue strength 5/5. No atrophy or fasciculations Muscular examination:                                  Inspection:  No atrophy or fasciculation                                 R L  R L   Orb. Oculi 5 5 Neck Flex 5   Orb. Oris 5 5 Neck Ext 5           Shoulder Abd 5 5 Hip Flex 5 5   Elbow Flex 5 5 Hip Ext     Elbow Ext 5 5 Hip Abd 5 5   Wrist Flex 5 5 Hip Add 5 5   Wrist Ext 5 5 Knee Ext 5 5   Finger Flex 5 5 Knee Flex 5 5   Finger Ext 5 5 Ank. Dorsiflex 5 5   Finger Abd 5 5 Ank. Plantar Flex 5 5   Thumb Abd 5 5 Ank. Eversion     Thumb Ext   Ank. Inversion     Thumb Add   Toe Ext 4 4      Toe flex 4 4                               Reflexes:                                    R L  R L   Biceps 3 3 Patellar 3 2   Triceps 3 3 Ankle 2+ 2+  Brachioradialis 3 3              Hoffman + + Planter Downgoing Downgoing     Tone: Normal    Sensory examination:     Light touch: Normal                                                                                                                        Pinprick: R L   UEs wrist elbow   LEs ankle midleg        Vibration: R L   Fingers Normal Normal   Toes 10 10                                                                                                                                      Proprioception: R L   Fingers Normal Normal   Toes Normal Normal       Cerebellar:  FTN: Normal  HTS: unable to do due to joints issues    Gait:  Cautious gait  Walks on toes but difficult to walk on heels      Romberg: Pos          Assessment and Plan:        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 Will look for an etiology and proceed with EMG to confirm the diagnosis. 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 EMG PROCEDURE ??? COMPREHENSIVE METABOLIC PANEL   ??? CBC AND DIFF   ??? IMMUNOFIXATION, SERUM (IFES)   ??? HEMOGLOBIN A1C   ??? SYPHILIS AB SCREEN   ??? VITAMIN B12   ??? VITAMIN B1 (THIAMINE) WHOLE BLD   ??? COPPER   ??? METHYLMALONIC ACID QUANT   ??? AMB REFERRAL TO PHYSICAL THERAPY   ??? nortriptyline (PAMELOR) 10 mg capsule         Total time 60 minutes.  More than 50% of the total time was used for counseling.  Counseled patient regarding recommended approach to his neuropathy.      Retta Diones, MD  Assistant Professor  Neurology/Neuromuscular Medicine

## 2018-11-06 LAB — HEMOGLOBIN A1C: Lab: 5.5 % (ref 4.0–6.0)

## 2018-11-06 LAB — SYPHILIS AB SCREEN: Lab: NEGATIVE g/dL (ref 13.5–16.5)

## 2018-11-06 LAB — IMMUNOFIXATION, SERUM (IFES)

## 2018-11-08 LAB — COPPER: Lab: 1.2 g/dL (ref 32.0–36.0)

## 2018-11-09 LAB — VITAMIN B1 (THIAMINE) WHOLE BLD: Lab: 191 FL — ABNORMAL HIGH (ref >60)

## 2018-11-11 LAB — METHYLMALONIC ACID QUANT: Lab: 0.1 pg (ref 26–34)

## 2018-11-26 ENCOUNTER — Encounter: Admit: 2018-11-26 | Discharge: 2018-11-26

## 2018-11-26 ENCOUNTER — Ambulatory Visit: Admit: 2018-11-26 | Discharge: 2018-11-27

## 2018-11-26 DIAGNOSIS — G5601 Carpal tunnel syndrome, right upper limb: Secondary | ICD-10-CM

## 2018-11-26 DIAGNOSIS — G629 Polyneuropathy, unspecified: Secondary | ICD-10-CM

## 2018-11-26 DIAGNOSIS — G5621 Lesion of ulnar nerve, right upper limb: Secondary | ICD-10-CM

## 2018-11-26 NOTE — Progress Notes
Procedure Time Out Check List:    Prior to the start of the procedure, I personally confirmed the following:      Patient Identity (name & date of birth): Yes  Procedure: Yes  Site: Yes  Body Part: Yes    The risks of the procedure, including infection, bleeding, and pain, were discussed with the patient.    Pain was 1/10 before and after the procedure      Test was completed without complication.      Sherrie Sport, MD  Assistant Professor  Neurology/Neuromuscular Medicine

## 2018-11-27 DIAGNOSIS — G5623 Lesion of ulnar nerve, bilateral upper limbs: Secondary | ICD-10-CM

## 2018-11-27 DIAGNOSIS — R2689 Other abnormalities of gait and mobility: Secondary | ICD-10-CM

## 2018-11-27 DIAGNOSIS — G5603 Carpal tunnel syndrome, bilateral upper limbs: Secondary | ICD-10-CM

## 2018-11-29 ENCOUNTER — Encounter: Admit: 2018-11-29 | Discharge: 2018-11-29

## 2018-11-29 NOTE — Progress Notes
No PA required for CPT codes 20205 and [phone] patient has Medicare and it is not required.

## 2018-11-29 NOTE — Progress Notes
Patient currently scheduled with Dr. Rickard Patience on 01/08/2019 @ 100pm for [facility]. Notification letter placed in the mail to patient. Dr. Loistine Simas notified via staff message.     Patient notified of above via VM.     PA notification request sent to support staff.  Pre-Procedure call to take place 01/01/2019. Patient reminder sent to support staff.

## 2018-12-02 ENCOUNTER — Encounter: Admit: 2018-12-02 | Discharge: 2018-12-02

## 2018-12-02 NOTE — Telephone Encounter
Pt requested ultrasound to be done at [facility] - they do not complete nerve ultrasounds.   LVM with patient and my direct call back information to discuss. Nothing further.

## 2019-01-07 ENCOUNTER — Encounter: Admit: 2019-01-07 | Discharge: 2019-01-07

## 2019-01-07 DIAGNOSIS — G5623 Lesion of ulnar nerve, bilateral upper limbs: Secondary | ICD-10-CM

## 2019-01-07 DIAGNOSIS — G5603 Carpal tunnel syndrome, bilateral upper limbs: Secondary | ICD-10-CM

## 2019-01-08 ENCOUNTER — Ambulatory Visit: Admit: 2019-01-08 | Discharge: 2019-01-08

## 2019-01-08 ENCOUNTER — Encounter: Admit: 2019-01-08 | Discharge: 2019-01-08

## 2019-01-08 DIAGNOSIS — G5623 Lesion of ulnar nerve, bilateral upper limbs: Secondary | ICD-10-CM

## 2019-01-08 DIAGNOSIS — G5603 Carpal tunnel syndrome, bilateral upper limbs: Secondary | ICD-10-CM

## 2019-01-08 DIAGNOSIS — G629 Polyneuropathy, unspecified: Secondary | ICD-10-CM

## 2019-01-08 NOTE — Procedures
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    HISTORY OF PRESENT ILLNESS:  Patient with few year history of numbness, tingling and pain in the legs and hands. Patient denies any allergies to anesthetics or adhesive substances.     VITAL SIGNS: stable   Vitals:    01/08/19 1314   BP: (!) 166/89   Pulse: 92     Procedure Time Out Check List:  Prior to the start of the procedure, I personally confirmed the following:    Site Marking Verified: Yes  Patient Identity (name & date of birth): Yes  Procedure: Yes  Site: Yes  Body Part: Yes    The risks of the procedure, including infection, bleeding, and pain, were discussed with the patient.    Preop pain score was 1 and postop pain score was 1.    PROCEDURE NOTE: Informed consent was obtained prior to procedure after the risks and benefits were explained to patient. The patient was positioned in right lateral position. two biopsy sites (left distal leg and proximal thigh) were identified and cleaned with alcohol.  Skin biopsies were performed at the 3 sites under local anesthesia using 1% lidocaine. two skin specimens were obtained and placed into Zambonis fixative and stored for further processing.  Bandage was applied over the biopsy sites after homeostasis was obtained.  The patient did not have any complications during or after the procedure or side effects from medications.The patient was discharged in stable condition and provided with care sheet.  Patient was recommended to follow up with Dr Loistine Simas as planned.    Total time spent with the patient is 30 minutes.  Majority of this time was spent on procedure itself and explanation of benefits and risks of the procedure.          Ollen Barges, M.D.  Professor  Department of Neurology

## 2019-01-08 NOTE — Telephone Encounter
[facility] PRE-PROCEDURE PLANNING:    Kyle Cunningham confirms receipt of [facility] procedure/ instruction letter.     Kyle Cunningham denies tape adhesive sensitivity, allergies to Lidocaine, epinephrine, iodine and neosporin.    Current Allergies: Codeine    Anticoagulant Medications: Baby aspirin    Immunesuppressive Medications: none    [name] had no further questions at this time. Confirmed appointment date of 01/08/2019 @ 1pm.

## 2019-01-17 ENCOUNTER — Encounter: Admit: 2019-01-17 | Discharge: 2019-01-17

## 2019-01-17 DIAGNOSIS — G629 Polyneuropathy, unspecified: Secondary | ICD-10-CM

## 2019-01-17 DIAGNOSIS — G5603 Carpal tunnel syndrome, bilateral upper limbs: Secondary | ICD-10-CM

## 2019-01-29 NOTE — Telephone Encounter
Pt LVM inquiring he still has not received a call from orthopedics surgery clinic. Calling pt back to give  number to call and schedule his apt. Pt verbalized understanding and had no new questions at this time.

## 2019-02-03 ENCOUNTER — Ambulatory Visit: Admit: 2019-02-03 | Discharge: 2019-02-04

## 2019-02-03 ENCOUNTER — Encounter: Admit: 2019-02-03 | Discharge: 2019-02-03

## 2019-02-03 DIAGNOSIS — M1990 Unspecified osteoarthritis, unspecified site: Secondary | ICD-10-CM

## 2019-02-03 DIAGNOSIS — M48061 Spinal stenosis, lumbar region without neurogenic claudication: Secondary | ICD-10-CM

## 2019-02-03 DIAGNOSIS — R079 Chest pain, unspecified: Secondary | ICD-10-CM

## 2019-02-03 DIAGNOSIS — M549 Dorsalgia, unspecified: Secondary | ICD-10-CM

## 2019-02-03 DIAGNOSIS — L0390 Cellulitis, unspecified: Secondary | ICD-10-CM

## 2019-02-03 DIAGNOSIS — G5623 Lesion of ulnar nerve, bilateral upper limbs: Secondary | ICD-10-CM

## 2019-02-03 DIAGNOSIS — J302 Other seasonal allergic rhinitis: Secondary | ICD-10-CM

## 2019-02-03 DIAGNOSIS — I10 Essential (primary) hypertension: Secondary | ICD-10-CM

## 2019-02-03 DIAGNOSIS — E785 Hyperlipidemia, unspecified: Secondary | ICD-10-CM

## 2019-02-03 DIAGNOSIS — F99 Mental disorder, not otherwise specified: Secondary | ICD-10-CM

## 2019-02-03 DIAGNOSIS — G5603 Carpal tunnel syndrome, bilateral upper limbs: Secondary | ICD-10-CM

## 2019-02-03 DIAGNOSIS — Q999 Chromosomal abnormality, unspecified: Secondary | ICD-10-CM

## 2019-02-03 DIAGNOSIS — K5792 Diverticulitis of intestine, part unspecified, without perforation or abscess without bleeding: Secondary | ICD-10-CM

## 2019-02-03 DIAGNOSIS — R200 Anesthesia of skin: Secondary | ICD-10-CM

## 2019-02-03 DIAGNOSIS — Z8719 Personal history of other diseases of the digestive system: Secondary | ICD-10-CM

## 2019-02-03 NOTE — Progress Notes
Date of Service: 02/03/2019        History of Present Illness:  Mr. Kyle Cunningham is a pleasant 68 year old, right-hand dominant, retired male.  He presents for evaluation of bilateral hand paresthesias.  He has a complicated history related to his hands and peripheral compression neuropathy.  He reports at least five to six surgical procedures on each of his upper extremities aimed at addressing peripheral compression neuropathy.  These were performed by multiple orthopedic surgeons.  The last of these procedures was approximately five years ago, the first in the ???80s.  He reports the last surgical procedure was only helpful for four or five months with return of symptoms.  He was having prominent nocturnal complaints, but is now taking nortriptyline and this seems to help.  He uses gloves to improve his sense of constant paresthesias.  He drops objects.  He is not using night splints now as he finds himself able to sleep with the nortriptyline.  He has tried Lyrica in the past.  He is also taking Cymbalta.      Medical History:   Diagnosis Date   ??? Arthritis     low back, shoulders, neck   ??? Back pain    ??? Cellulitis 2004    left leg   ??? Chest pain    ??? Diverticulitis    ??? Genetic defect    ??? History of diverticulosis    ??? Hyperlipidemia    ??? Hypertension    ??? Lumbar stenosis    ??? Numbness in both hands    ??? Numbness in feet    ??? Osteoarthritis    ??? Psychiatric illness     depression   ??? Seasonal allergies        Surgical History:   Procedure Laterality Date   ??? L4-5 redo laminectomy and foraminotomy w/ excision of left facet cyst Left 10/09/2014    Performed by Karie Chimera, MD at [facility] OR   ??? ANTERIOR CERVICAL DISCECTOMY AND FUSION AT CERVICAL 3-4 WITH ANTERIOR CERVICAL PLATE N/A 60/45/4098    Performed by Karie Chimera, MD at [facility] OR   ??? DECOMPRESSIVE THORACIC LAMINECTOMY AT THORACIC 12 TO LUMBAR 1, REPEAT LAMINECTOMY AT LUMBAR 4-5 WITH POSTEROLATERAL INSTRUMENTED FUSION AT LUMBAR 4-5; REPAIR OF TONGUE LACERATION N/A 12/28/2015    Performed by Karie Chimera, MD at [facility] OR   ??? LIPOMA RESECTION  08/2016    abdominal    ??? REMOVAL OF CERVICAL PLATE CERVICAL 3-4 AND ANTERIOR CERVICAL DISCECTOMY AND FUSION AT CERVICAL 4-5 AND CERVICAL 5-6 WITH ANTERIOR CERVICAL PLATE N/A 06/05/9145    Performed by Karie Chimera, MD at [facility] OR   ??? ABDOMINAL HERNIA REPAIR      x2   ??? CARPAL TUNNEL RELEASE Bilateral     multiple   ??? CHOLECYSTECTOMY     ??? HIP REPLACEMENT Left    ??? HX TONSILLECTOMY     ??? KNEE REPLACEMENT Bilateral    ??? ULNAR TUNNEL RELEASE Bilateral        Family History   Problem Relation Age of Onset   ??? Cancer Mother    ??? Hypertension Mother    ??? Hypertension Father        Social History     Occupational History   ??? Not on file   Tobacco Use   ??? Smoking status: Former Smoker     Years: 17.00     Types: Cigars   ??? Smokeless tobacco: Former Neurosurgeon  Types: Dorna Bloom     Quit date: 11/07/2015   ??? Tobacco comment: smokes cigars a few times a week hasnt had a cigar since 2016.  Hasnt had chew since 10/2015   Substance and Sexual Activity   ??? Alcohol use: Yes     Alcohol/week: 6.0 standard drinks     Types: 6 Cans of beer per week     Comment: 6 beers a week   ??? Drug use: Not Currently   ??? Sexual activity: Not Currently     Partners: Female     Birth control/protection: None       Allergies   Allergen Reactions   ??? Codeine STOMACH UPSET         Review of Systems   Musculoskeletal: Positive for arthralgias.   Neurological: Positive for numbness.        Tingling    All other systems reviewed and are negative.        Objective:         ??? acetaminophen (TYLENOL) 325 mg tablet Take 2 Tabs by mouth every 6 hours as needed.   ??? aspirin EC 81 mg tablet Take 81 mg by mouth daily. Take with food.   ??? atorvastatin (LIPITOR) 20 mg tablet Take 20 mg by mouth daily.   ??? bisoprolol/hydrochlorothiazide (ZIAC)  10/6.25 mg tablet 1 Tab Take 1 Tab by mouth daily. ??? calcium carbonate/vitamin D-3 (OSCAL-500+D) 1250 mg/200 unit tablet Take 1 Tab by mouth daily. Calcium Carb 1250mg  delivers 500mg  elemental Ca   ??? cetirizine (ZYRTEC) 10 mg tablet Take 10 mg by mouth every morning.   ??? docusate (COLACE) 100 mg capsule Take 1 Cap by mouth twice daily.   ??? DULoxetine DR (CYMBALTA) 30 mg capsule Take 30 mg by mouth twice daily.   ??? fluticasone (FLONASE) 50 mcg/actuation nasal spray Apply 1-2 Sprays to each nostril as directed daily as needed. Indications: ALLERGIC RHINITIS   ??? lisinopril (PRINIVIL; ZESTRIL) 10 mg tablet Take 10 mg by mouth daily.   ??? naphazoline 0.025 % /pheniramine 0.3 % (ALLERGY EYE (NAPHAZOLINE-PHEN)) 0.025/0.3 % drop Apply 1 Drop to both eyes every 4 hours as needed.   ??? nortriptyline (PAMELOR) 10 mg capsule 10 mg qhs for one week then increase by 10 mg every week for goal of 50 mg qhs   ??? oxyCODONE (ROXICODONE, OXY-IR) 5 mg tablet Take one tablet to two tablets by mouth every 4 hours as needed   ??? polyethylene glycol 3350 (GLYCOLAX; MIRALAX) 17 gram/dose powder Take 17 g by mouth daily. (Patient taking differently: Take 17 g by mouth daily as needed.)   ??? POTASSIUM/MAGNESIUM (MAGNESIUM-POTASSIUM PO) Take 1 Tab by mouth daily.   ??? sodium chloride(+) (AK-NACL; ALTACHLORE; MURO; SOCHLOR) 5 % ophthalmic ointment Apply 0.25 inches to both eyes twice daily.   ??? tamsulosin (FLOMAX) 0.4 mg capsule Take 0.4 mg by mouth daily.   ??? vitamins, multiple tablet Take 1 Tab by mouth daily.     Vitals:    02/03/19 1021   Weight: 104.3 kg (230 lb)   Height: 167.6 cm (66)     Body mass index is 37.12 kg/m???.     Physical Exam  Ortho Exam  Alert male, no acute distress.  He is appropriately dressed and interactive.  Oriented x 3.  On evaluation of the patient???s bilateral hands and wrists, multiple surgical procedures on the wrist and at the antecubital fossa.  He has a very mildly positive Tinel???s over the carpal tunnel.  There  is no obvious thenar atrophy.  Composite finger flexion to the palm.  2-point discrimination is difficult to determine in any of the digits of his left hand.    EMG:  An EMG from 11/26/2018 was reviewed.  The interpretation is of prolonged sensory and motor latency in the right median nerve at the wrist.  Similar findings at the ulnar nerve at the elbow.       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 Schnoor that given the many surgical procedures he has had on the median nerve at the wrist, I do not think that another surgical procedure given his exam today is likely to be of much benefit.  The median nerve does not seem to be overly irritable in one location and his sensation is already quite poor.  He is certainly welcome to seek another opinion, but I am not optimistic that further surgery on his wrist is going to be helpful.  The patient has also had surgery on his neck which may certainly complicate the situation.  He was in agreement with this and also not optimistic that further surgery was going to be of benefit.  He was appreciative of our discussion.  All of his questions were answered.               Dictated by Diego Cory, MD and transcribed via [employer]. Copied and pasted into [facility] by Baldo Ash, 02/05/2019 2:58 PM

## 2019-02-04 DIAGNOSIS — G629 Polyneuropathy, unspecified: Secondary | ICD-10-CM

## 2019-03-03 NOTE — Progress Notes
DEPARTMENT OF NEUROLOGY  [facility]  [address]  [place], [place] [address]         NEUROLOGY SKIN BIOPSY REPORT  NEUROMUSCULAR SECTION    DATE:  03/03/2019    Patient Name: Kyle Cunningham   Date of Birth: [birth year]  MRN: [id number]    Biopsy #:[id number]  Date of biopsy: 01/08/2019  Biopsy sites:  Left distal leg and proximal thigh  Referring Physician: Retta Diones, MD    History: Patient with few year history of numbness and tingling    Procedures: 3mm punch biopsy with Zamboni?s fixation using standard PGP9.5 Immunostaining.    MICROSCOPIC FINDINGS Nerve Fiber Density (fibers/mm)    Normal Value   Left Distal Leg     Nerve Fibers Count:   0.8 >2.8 fibers/mm   The epidermal fiber density is Decreased    The nerve fiber morphology is unable to asses due to few fibers         Left Proximal Thigh     Nerve Fibers Count: 6.0 >7.0 fibers/mm   The epidermal fiber density is Normal    The nerve fiber morphology is Normal             IMPRESSION:  The two biopsies reveal: decreased epidermal nerve fiber density at both the sites.  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     Electronically Signed by: Bing Quarry, MD    Bing Quarry, MD                                  Jamse Arn, M.D.  Professor                                             Professor and Director, Neuromuscular Section  Department of Neurology                                   Department of Neurology      Wyn Quaker, MD                                               Eda Keys, MD       Assistant Professor                                             Assistant Professor  Department of Neurology                                    Department of Neurology     Retta Diones, MD                                              Janett Labella, MD  Assistant Professor                                             Assistant Professor                                             Department of Neurology                                     Department of Neurology     Leatrice Jewels, MD  Assistant Professor  Department of Neurology                                            For further information on skin biopsy techniques, see:  Cutaneous innervation in sensory neuropathies: Evaluation by skin biopsy. [place] BG?Bevelyn Buckles,  [phone]; [phone]  Intraepidermal nerve fiber density in patients with painful sensory neuropathy. [place]?Bevelyn Buckles, Neurology [phone].  Epidermal nerve fiber density: normative reference range and diagnostic efficiency. Bevelyn Buckles?Vivien Rossetti. Arch Neurol, [id number]; [phone].  Another tool for the neurologists toolbox. Bevelyn Buckles and Fabio Neighbors Neurol 2005, [phone]: [phone].  EFNS guidelines on the use of skin biopsy in the diagnosis of peripheral neuropathy. Lauria G?.Sommer C. Eur Docia Barrier, 2005, 12:[phone].  Intraepidermal nerve fiber density at the distal leg:a worldwide normative reference study. Eliane Decree. Journal of the Peripheral Nervous System [phone] (2010).

## 2019-03-07 ENCOUNTER — Encounter: Admit: 2019-03-07 | Discharge: 2019-03-07 | Payer: MEDICARE

## 2019-03-07 NOTE — Progress Notes
Placed result letter in mail to patient. Patient to contact Dr.Jabari's office with any questions regarding results.

## 2019-03-10 MED ORDER — NORTRIPTYLINE 50 MG PO CAP
50 mg | ORAL_CAPSULE | Freq: Every evening | ORAL | 5 refills | Status: AC
Start: 2019-03-10 — End: ?

## 2019-12-22 ENCOUNTER — Encounter: Admit: 2019-12-22 | Discharge: 2019-12-22 | Payer: MEDICARE

## 2019-12-22 NOTE — Telephone Encounter
Patient left VM stating that he does not want to reschedule his one year follow up appointment at this time.

## 2021-02-08 ENCOUNTER — Encounter: Admit: 2021-02-08 | Discharge: 2021-02-08 | Payer: MEDICARE

## 2021-02-08 DIAGNOSIS — M48061 Spinal stenosis, lumbar region without neurogenic claudication: Secondary | ICD-10-CM

## 2021-02-14 ENCOUNTER — Encounter: Admit: 2021-02-14 | Discharge: 2021-02-14 | Payer: MEDICARE

## 2021-02-14 ENCOUNTER — Ambulatory Visit: Admit: 2021-02-14 | Discharge: 2021-02-14 | Payer: MEDICARE

## 2021-02-14 DIAGNOSIS — I10 Essential (primary) hypertension: Secondary | ICD-10-CM

## 2021-02-14 DIAGNOSIS — E785 Hyperlipidemia, unspecified: Secondary | ICD-10-CM

## 2021-02-14 DIAGNOSIS — R29898 Other symptoms and signs involving the musculoskeletal system: Secondary | ICD-10-CM

## 2021-02-14 DIAGNOSIS — R079 Chest pain, unspecified: Secondary | ICD-10-CM

## 2021-02-14 DIAGNOSIS — M549 Dorsalgia, unspecified: Secondary | ICD-10-CM

## 2021-02-14 DIAGNOSIS — K5792 Diverticulitis of intestine, part unspecified, without perforation or abscess without bleeding: Secondary | ICD-10-CM

## 2021-02-14 DIAGNOSIS — M1990 Unspecified osteoarthritis, unspecified site: Secondary | ICD-10-CM

## 2021-02-14 DIAGNOSIS — M48061 Spinal stenosis, lumbar region without neurogenic claudication: Secondary | ICD-10-CM

## 2021-02-14 DIAGNOSIS — L0390 Cellulitis, unspecified: Secondary | ICD-10-CM

## 2021-02-14 DIAGNOSIS — M545 Lumbar pain: Secondary | ICD-10-CM

## 2021-02-14 DIAGNOSIS — Q999 Chromosomal abnormality, unspecified: Secondary | ICD-10-CM

## 2021-02-14 DIAGNOSIS — F99 Mental disorder, not otherwise specified: Secondary | ICD-10-CM

## 2021-02-14 DIAGNOSIS — J302 Other seasonal allergic rhinitis: Secondary | ICD-10-CM

## 2021-02-14 DIAGNOSIS — Z8719 Personal history of other diseases of the digestive system: Secondary | ICD-10-CM

## 2021-02-14 DIAGNOSIS — R200 Anesthesia of skin: Secondary | ICD-10-CM
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
## 2024-06-24 ENCOUNTER — Encounter: Admit: 2024-06-24 | Discharge: 2024-06-24 | Payer: MEDICARE

## 2024-06-25 ENCOUNTER — Encounter: Admit: 2024-06-25 | Discharge: 2024-06-25 | Payer: MEDICARE

## 2024-06-25 DIAGNOSIS — M4802 Spinal stenosis, cervical region: Principal | ICD-10-CM
